# Patient Record
Sex: Male | Born: 1995 | Race: Black or African American | Hispanic: No | Marital: Single | Smoking: Never smoker
Health system: Southern US, Community
[De-identification: ages and names within clinical notes are randomized; demographics above are authoritative.]

## PROBLEM LIST (undated history)

## (undated) DIAGNOSIS — J40 Bronchitis, not specified as acute or chronic: Secondary | ICD-10-CM

## (undated) DIAGNOSIS — J45909 Unspecified asthma, uncomplicated: Secondary | ICD-10-CM

## (undated) DIAGNOSIS — M1990 Unspecified osteoarthritis, unspecified site: Secondary | ICD-10-CM

## (undated) HISTORY — DX: Unspecified asthma, uncomplicated: J45.909

---
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
## 2017-06-15 ENCOUNTER — Emergency Department (HOSPITAL_COMMUNITY)
Admission: EM | Admit: 2017-06-15 | Discharge: 2017-06-15 | Disposition: A | Discharge status: Home / Self Care | Payer: Medicaid Other | Attending: Emergency Medicine | Admitting: Emergency Medicine

## 2017-06-15 ENCOUNTER — Encounter (HOSPITAL_COMMUNITY): Payer: Self-pay | Admitting: Adult Health

## 2017-06-15 ENCOUNTER — Emergency Department (HOSPITAL_COMMUNITY): Payer: Medicaid Other

## 2017-06-15 DIAGNOSIS — S99912A Unspecified injury of left ankle, initial encounter: Secondary | ICD-10-CM | POA: Diagnosis present

## 2017-06-15 DIAGNOSIS — M25472 Effusion, left ankle: Secondary | ICD-10-CM | POA: Diagnosis not present

## 2017-06-15 DIAGNOSIS — W03XXXA Other fall on same level due to collision with another person, initial encounter: Secondary | ICD-10-CM | POA: Insufficient documentation

## 2017-06-15 DIAGNOSIS — Y92321 Football field as the place of occurrence of the external cause: Secondary | ICD-10-CM | POA: Diagnosis not present

## 2017-06-15 DIAGNOSIS — Y999 Unspecified external cause status: Secondary | ICD-10-CM | POA: Diagnosis not present

## 2017-06-15 DIAGNOSIS — Y9361 Activity, american tackle football: Secondary | ICD-10-CM | POA: Diagnosis not present

## 2017-06-15 DIAGNOSIS — S96912A Strain of unspecified muscle and tendon at ankle and foot level, left foot, initial encounter: Secondary | ICD-10-CM | POA: Diagnosis not present

## 2017-06-15 DIAGNOSIS — J45909 Unspecified asthma, uncomplicated: Secondary | ICD-10-CM | POA: Insufficient documentation

## 2017-06-15 MED ORDER — TRAMADOL HCL 50 MG PO TABS: 50.0000 mg | ORAL_TABLET | Freq: Four times a day (QID) | ORAL | 0 refills | Status: AC | PRN

## 2017-06-15 NOTE — ED Triage Notes (Signed)
Presents with left ankle injury. injury occurred last night while playing football. Using ice, elevation and ibuprofen last night at home. Pain is worse with movement. Swelling noted to lateral aspect of left ankle.

## 2017-06-15 NOTE — ED Notes (Signed)
Pt denies taking anything at home for pain today.  Pt states unable to put weight on left ankle

## 2017-06-15 NOTE — ED Provider Notes (Signed)
[facility]-EMERGENCY DEPT Provider Note   CSN: [id number] Arrival date & time: 06/15/17  1015     History   Chief Complaint Chief Complaint  Patient presents with  . Ankle Pain    HPI Chad Snyder is a 21 y.o. male.  HPI  Chad Snyder is a 21 y.o. male who presents to the Emergency Department complaining of left ankle pain and swelling for one day.  He states that he injured his ankle while playing football and ankle "rolled" during a tackle.  Complains of pain and swelling to the lateral ankle that is worse with weight bearing.  Taken ibuprofen w/o relief.  He is unable to bear weight du to pain.  He denies neck, back or head pain, LOC, numbness or weakness to the lower extremities..   Past Medical History:  Diagnosis Date  . Arthritis   . Asthma   . Bronchitis     Patient Active Problem List   Diagnosis Date Noted  . Finger fracture 01/12/2013    History reviewed. No pertinent surgical history.     Home Medications    Prior to Admission medications   Medication Sig Start Date End Date Taking? Authorizing Provider  albuterol (PROVENTIL HFA;VENTOLIN HFA) 108 (90 Base) MCG/ACT inhaler Inhale 1-2 puffs into the lungs every 6 (six) hours as needed for wheezing or shortness of breath. 11/30/15   Ivery Quale, PA-C  HYDROcodone-acetaminophen (NORCO/VICODIN) 5-325 MG per tablet Take 1 tablet by mouth every 4 (four) hours as needed for pain. Patient not taking: Reported on 07/25/2015 01/01/13   Janne Napoleon, NP  ibuprofen (ADVIL,MOTRIN) 600 MG tablet Take 1 tablet (600 mg total) by mouth every 6 (six) hours as needed for pain. Patient not taking: Reported on 07/25/2015 01/01/13   Janne Napoleon, NP  traMADol (ULTRAM) 50 MG tablet Take 1 tablet (50 mg total) by mouth every 6 (six) hours as needed. 06/15/17   Pauline Aus, PA-C    Family History Family History  Problem Relation Age of Onset  . Cancer Unknown   . Asthma Unknown   . Diabetes Unknown      Social History Social History  Substance Use Topics  . Smoking status: Never Smoker  . Smokeless tobacco: Not on file  . Alcohol use No     Allergies   Peanuts [peanut oil]   Review of Systems Review of Systems  Constitutional: Negative for chills and fever.  Gastrointestinal: Negative for nausea and vomiting.  Musculoskeletal: Positive for arthralgias (left ankle pain) and joint swelling.  Skin: Negative for color change and wound.  Neurological: Negative for weakness and numbness.  Hematological: Negative for adenopathy.  All other systems reviewed and are negative.    Physical Exam Updated Vital Signs BP (!) 152/84   Pulse (!) 57   Temp 98.2 F (36.8 C) (Oral)   Resp 18   Ht 5\' 10"  (1.778 m)   Wt 68.9 kg (152 lb)   SpO2 100%   BMI 21.81 kg/m   Physical Exam  Constitutional: He is oriented to person, place, and time. He appears well-developed and well-nourished. No distress.  HENT:  Head: Normocephalic and atraumatic.  Cardiovascular: Normal rate, regular rhythm, normal heart sounds and intact distal pulses.   Pulmonary/Chest: Effort normal and breath sounds normal.  Musculoskeletal: He exhibits tenderness.  ttp of the lateral left ankle, mild to moderate edema.  No open wounds.   No erythema, abrasion, bruising or bony deformity.  No proximal tenderness. Compartments soft  Neurological: He is alert and oriented to person, place, and time. He exhibits normal muscle tone. Coordination normal.  Skin: Skin is warm and dry. Capillary refill takes less than 2 seconds.  Nursing note and vitals reviewed.    ED Treatments / Results  Labs (all labs ordered are listed, but only abnormal results are displayed) Labs Reviewed - No data to display  EKG  EKG Interpretation None       Radiology Dg Ankle Complete Left  Result Date: 06/15/2017 CLINICAL DATA:  Pain after trauma EXAM: LEFT ANKLE COMPLETE - 3+ VIEW COMPARISON:  None. FINDINGS: Lateral soft tissue  swelling. No fracture or dislocation. The ankle mortise is intact. No other acute abnormalities. IMPRESSION: Soft-tissue swelling with no underlying bony abnormality identified. Electronically Signed   By: Gerome Sam III M.D   On: 06/15/2017 11:40    Procedures Procedures (including critical care time)  Medications Ordered in ED Medications - No data to display   Initial Impression / Assessment and Plan / ED Course  I have reviewed the triage vital signs and the nursing notes.  Pertinent labs & imaging results that were available during my care of the patient were reviewed by me and considered in my medical decision making (see chart for details).     NV intact.  XR reasuring.   Crutches and ASO splint applied.  Pain improved.   Pt prefers Romeo Apple f/u and agrees to RICE therapy.    Final Clinical Impressions(s) / ED Diagnoses   Final diagnoses:  Strain of left ankle, initial encounter    New Prescriptions Discharge Medication List as of 06/15/2017 12:07 PM    START taking these medications   Details  traMADol (ULTRAM) 50 MG tablet Take 1 tablet (50 mg total) by mouth every 6 (six) hours as needed., Starting Sat 06/15/2017, Print         Pauline Aus, PA-C 06/15/17 2049    Bethann Berkshire, MD 06/16/17 [phone]

## 2017-06-15 NOTE — Discharge Instructions (Signed)
Elevate and apply ice packs on;/off.  Use the crutches for weight bearing for at least one week.  Take ibuprofen 600 mg every 6-8 hrs for pain.  Call Dr. Mort Sawyers office to arrange a follow-up appt.

## 2017-06-20 ENCOUNTER — Ambulatory Visit (INDEPENDENT_AMBULATORY_CARE_PROVIDER_SITE_OTHER): Payer: Medicaid Other | Admitting: Orthopaedic Surgery

## 2017-06-20 ENCOUNTER — Encounter: Payer: Self-pay | Admitting: Orthopaedic Surgery

## 2017-06-20 VITALS — BP 121/64 | HR 66 | Temp 97.7°F | Ht 68.0 in | Wt 153.0 lb

## 2017-06-20 DIAGNOSIS — S96912A Strain of unspecified muscle and tendon at ankle and foot level, left foot, initial encounter: Secondary | ICD-10-CM

## 2017-06-20 NOTE — Progress Notes (Signed)
Subjective:    Patient ID: Chad Snyder, male    DOB: [birth year], 21 y.o.   MRN: [id number]  HPI He hurt his left ankle playing football last Friday night for [employer].  He is a running back.  He fell in a twisting injury and then another player landed on him.  He had X-rays done the following day at the ER and they were negative. He has crutches and an ankle brace.  He has no other injury.  He is on Tramadol and using ice.   Review of Systems  HENT: Negative for congestion.   Respiratory: Negative for cough and shortness of breath.   Cardiovascular: Negative for chest pain and leg swelling.  Endocrine: Negative for cold intolerance.  Musculoskeletal: Positive for arthralgias, gait problem and joint swelling.  Allergic/Immunologic: Negative for environmental allergies.  All other systems reviewed and are negative.  Past Medical History:  Diagnosis Date  . Arthritis   . Asthma   . Bronchitis     History reviewed. No pertinent surgical history.  Current Outpatient Prescriptions on File Prior to Visit  Medication Sig Dispense Refill  . albuterol (PROVENTIL HFA;VENTOLIN HFA) 108 (90 Base) MCG/ACT inhaler Inhale 1-2 puffs into the lungs every 6 (six) hours as needed for wheezing or shortness of breath. 1 Inhaler 0  . traMADol (ULTRAM) 50 MG tablet Take 1 tablet (50 mg total) by mouth every 6 (six) hours as needed. 15 tablet 0  . HYDROcodone-acetaminophen (NORCO/VICODIN) 5-325 MG per tablet Take 1 tablet by mouth every 4 (four) hours as needed for pain. (Patient not taking: Reported on 07/25/2015) 6 tablet 0  . ibuprofen (ADVIL,MOTRIN) 600 MG tablet Take 1 tablet (600 mg total) by mouth every 6 (six) hours as needed for pain. (Patient not taking: Reported on 07/25/2015) 30 tablet 0   No current facility-administered medications on file prior to visit.     Social History   Social History  . Marital status: Single    Spouse name: N/A  . Number of children: N/A  . Years  of education: N/A   Occupational History  . Not on file.   Social History Main Topics  . Smoking status: Never Smoker  . Smokeless tobacco: Never Used  . Alcohol use No  . Drug use: No  . Sexual activity: Not on file   Other Topics Concern  . Not on file   Social History Narrative  . No narrative on file    Family History  Problem Relation Age of Onset  . Cancer Unknown   . Asthma Unknown   . Diabetes Unknown   . Hypertension Paternal Grandfather     BP 121/64   Pulse 66   Temp 97.7 F (36.5 C)   Ht 5\' 8"  (1.727 m)   Wt 153 lb (69.4 kg)   BMI 23.26 kg/m      Objective:   Physical Exam  Constitutional: He is oriented to person, place, and time. He appears well-developed and well-nourished.  HENT:  Head: Normocephalic and atraumatic.  Eyes: Pupils are equal, round, and reactive to light. Conjunctivae and EOM are normal.  Neck: Normal range of motion. Neck supple.  Cardiovascular: Normal rate, regular rhythm and intact distal pulses.   Pulmonary/Chest: Effort normal.  Abdominal: Soft.  Musculoskeletal: Tenderness: Left ankle tender laterally, very minimal swelling, pain over anterior talo-fibular ligament, ROM full, NV intact. right side negative.  Neurological: He is alert and oriented to person, place, and time. He has normal  reflexes. He displays normal reflexes. No cranial nerve deficit. He exhibits normal muscle tone. Coordination normal.  Skin: Skin is warm and dry.  Psychiatric: He has a normal mood and affect. His behavior is normal. Judgment and thought content normal.  Vitals reviewed.         Assessment & Plan:   Encounter Diagnosis  Name Primary?  . Strain of left ankle, initial encounter Yes   Contrast bath sheet given.  Continue brace and crutches.  No football tomorrow.  Return in one week.  Call if any problem.  Precautions discussed.   Electronically Signed Darreld McleanWayne Burlon Centrella, MD 8/30/20189:05 AM

## 2017-06-20 NOTE — Patient Instructions (Addendum)
   Note for out of school

## 2017-06-27 ENCOUNTER — Ambulatory Visit (INDEPENDENT_AMBULATORY_CARE_PROVIDER_SITE_OTHER): Payer: Medicaid Other | Admitting: Orthopaedic Surgery

## 2017-06-27 ENCOUNTER — Encounter: Payer: Self-pay | Admitting: Orthopaedic Surgery

## 2017-06-27 VITALS — BP 130/70 | HR 66 | Temp 97.7°F | Ht 68.0 in | Wt 146.0 lb

## 2017-06-27 DIAGNOSIS — S96912D Strain of unspecified muscle and tendon at ankle and foot level, left foot, subsequent encounter: Secondary | ICD-10-CM | POA: Diagnosis not present

## 2017-06-27 NOTE — Progress Notes (Signed)
Patient OZ:HYQMVHQ:Chad Snyder, male DOB:01/31/1996, 21 y.o. ION:[id number]  Chief Complaint  Patient presents with  . Follow-up    Left ankle strain    HPI  Chad Snyder is a 21 y.o. male who has left ankle strain.  He is better. He has less pain of the ankle.  He has less swelling.  He has been using the crutches and ankle brace.  He may go to one crutch with the brace and weight bear as tolerated now. HPI  Body mass index is 22.2 kg/m.  ROS  Review of Systems  HENT: Negative for congestion.   Respiratory: Negative for cough and shortness of breath.   Cardiovascular: Negative for chest pain and leg swelling.  Endocrine: Negative for cold intolerance.  Musculoskeletal: Positive for arthralgias, gait problem and joint swelling.  Allergic/Immunologic: Negative for environmental allergies.  All other systems reviewed and are negative.   Past Medical History:  Diagnosis Date  . Arthritis   . Asthma   . Bronchitis     History reviewed. No pertinent surgical history.  Family History  Problem Relation Age of Onset  . Cancer Unknown   . Asthma Unknown   . Diabetes Unknown   . Hypertension Paternal Grandfather     Social History Social History  Substance Use Topics  . Smoking status: Never Smoker  . Smokeless tobacco: Never Used  . Alcohol use No    Allergies  Allergen Reactions  . Peanuts [Peanut Oil] Shortness Of Breath and Swelling    Current Outpatient Prescriptions  Medication Sig Dispense Refill  . albuterol (PROVENTIL HFA;VENTOLIN HFA) 108 (90 Base) MCG/ACT inhaler Inhale 1-2 puffs into the lungs every 6 (six) hours as needed for wheezing or shortness of breath. 1 Inhaler 0  . HYDROcodone-acetaminophen (NORCO/VICODIN) 5-325 MG per tablet Take 1 tablet by mouth every 4 (four) hours as needed for pain. (Patient not taking: Reported on 07/25/2015) 6 tablet 0  . ibuprofen (ADVIL,MOTRIN) 600 MG tablet Take 1 tablet (600 mg total) by mouth every 6  (six) hours as needed for pain. (Patient not taking: Reported on 07/25/2015) 30 tablet 0  . traMADol (ULTRAM) 50 MG tablet Take 1 tablet (50 mg total) by mouth every 6 (six) hours as needed. 15 tablet 0   No current facility-administered medications for this visit.      Physical Exam  Blood pressure 130/70, pulse 66, temperature 97.7 F (36.5 C), height 5\' 8"  (1.727 m), weight 146 lb (66.2 kg).  Constitutional: overall normal hygiene, normal nutrition, well developed, normal grooming, normal body habitus. Assistive device:crutches, ankle brace left  Musculoskeletal: gait and station Limp left, muscle tone and strength are normal, no tremors or atrophy is present.  .  Neurological: coordination overall normal.  Deep tendon reflex/nerve stretch intact.  Sensation normal.  Cranial nerves II-XII intact.   Skin:   Normal overall no scars, lesions, ulcers or rashes. No psoriasis.  Psychiatric: Alert and oriented x 3.  Recent memory intact, remote memory unclear.  Normal mood and affect. Well groomed.  Good eye contact.  Cardiovascular: overall no swelling, no varicosities, no edema bilaterally, normal temperatures of the legs and arms, no clubbing, cyanosis and good capillary refill.  Lymphatic: palpation is normal.  His left ankle has slight lateral swelling, NV intact.  Tender over the anterior talofibular ligament.  ROM is good.  The patient has been educated about the nature of the problem(s) and counseled on treatment options.  The patient appeared to understand what I have  discussed and is in agreement with it.  Encounter Diagnosis  Name Primary?  . Strain of left ankle, subsequent encounter Yes    PLAN Call if any problems.  Precautions discussed.  Continue current medications.   Return to clinic 2 weeks   Electronically Signed Darreld Mclean, MD 9/6/20188:34 AM

## 2017-06-27 NOTE — Patient Instructions (Signed)
May use one crutch, left ankle brace.  Weight bear as tolerated.

## 2017-07-11 ENCOUNTER — Encounter: Payer: Self-pay | Admitting: Orthopaedic Surgery

## 2017-07-11 ENCOUNTER — Ambulatory Visit (INDEPENDENT_AMBULATORY_CARE_PROVIDER_SITE_OTHER): Payer: Medicaid Other | Admitting: Orthopaedic Surgery

## 2017-07-11 VITALS — BP 123/83 | HR 63 | Ht 68.0 in | Wt 147.0 lb

## 2017-07-11 DIAGNOSIS — S96912D Strain of unspecified muscle and tendon at ankle and foot level, left foot, subsequent encounter: Secondary | ICD-10-CM

## 2017-07-11 NOTE — Progress Notes (Signed)
Patient Chad Snyder, male DOB:[birth year], 21 y.o. NWG:[id number]  Chief Complaint  Patient presents with  . Follow-up    left ankle    HPI  Chad Snyder is a 21 y.o. male who has had a strain of the left ankle. He is better now.  He has less pain.  He is still using his brace but has no crutches.  He has no new injury. HPI  Body mass index is 22.35 kg/m.  ROS  Review of Systems  HENT: Negative for congestion.   Respiratory: Negative for cough and shortness of breath.   Cardiovascular: Negative for chest pain and leg swelling.  Endocrine: Negative for cold intolerance.  Musculoskeletal: Positive for arthralgias, gait problem and joint swelling.  Allergic/Immunologic: Negative for environmental allergies.  All other systems reviewed and are negative.   Past Medical History:  Diagnosis Date  . Arthritis   . Asthma   . Bronchitis     History reviewed. No pertinent surgical history.  Family History  Problem Relation Age of Onset  . Cancer Unknown   . Asthma Unknown   . Diabetes Unknown   . Hypertension Paternal Grandfather     Social History Social History  Substance Use Topics  . Smoking status: Never Smoker  . Smokeless tobacco: Never Used  . Alcohol use No    Allergies  Allergen Reactions  . Peanuts [Peanut Oil] Shortness Of Breath and Swelling    Current Outpatient Prescriptions  Medication Sig Dispense Refill  . albuterol (PROVENTIL HFA;VENTOLIN HFA) 108 (90 Base) MCG/ACT inhaler Inhale 1-2 puffs into the lungs every 6 (six) hours as needed for wheezing or shortness of breath. 1 Inhaler 0  . HYDROcodone-acetaminophen (NORCO/VICODIN) 5-325 MG per tablet Take 1 tablet by mouth every 4 (four) hours as needed for pain. (Patient not taking: Reported on 07/25/2015) 6 tablet 0  . ibuprofen (ADVIL,MOTRIN) 600 MG tablet Take 1 tablet (600 mg total) by mouth every 6 (six) hours as needed for pain. (Patient not taking: Reported on 07/25/2015)  30 tablet 0  . traMADol (ULTRAM) 50 MG tablet Take 1 tablet (50 mg total) by mouth every 6 (six) hours as needed. 15 tablet 0   No current facility-administered medications for this visit.      Physical Exam  Blood pressure 123/83, pulse 63, height  (1.727 m), weight 147 lb (66.7 kg).  Constitutional: overall normal hygiene, normal nutrition, well developed, normal grooming, normal body habitus. Assistive device:ankle brace left  Musculoskeletal: gait and station Limp none, muscle tone and strength are normal, no tremors or atrophy is present.  .  Neurological: coordination overall normal.  Deep tendon reflex/nerve stretch intact.  Sensation normal.  Cranial nerves II-XII intact.   Skin:   Normal overall no scars, lesions, ulcers or rashes. No psoriasis.  Psychiatric: Alert and oriented x 3.  Recent memory intact, remote memory unclear.  Normal mood and affect. Well groomed.  Good eye contact.  Cardiovascular: overall no swelling, no varicosities, no edema bilaterally, normal temperatures of the legs and arms, no clubbing, cyanosis and good capillary refill.  Lymphatic: palpation is normal.  All other systems reviewed and are negative   His left ankle is not tender and has full ROM.  He has normal gait.  NV is intact.  It is stable.  The patient has been educated about the nature of the problem(s) and counseled on treatment options.  The patient appeared to understand what I have discussed and is in agreement with it.  Encounter Diagnosis  Name Primary?  . Strain of left ankle, subsequent encounter Yes    PLAN Call if any problems.  Precautions discussed.  Continue current medications.   Return to clinic PRN   I went over precautions for his ankle.  He needs to strap it before playing sports over the next four to six weeks.  Electronically Signed Darreld Mclean, MD 9/20/20188:12 AM

## 2019-07-16 IMAGING — DX DG ANKLE COMPLETE 3+V*L*
3 series · 3 of 3 positions shown · non-contrast
Comparison: None.

CLINICAL DATA: Pain after trauma

EXAM:
LEFT ANKLE COMPLETE - 3+ VIEW

[ankle ap]
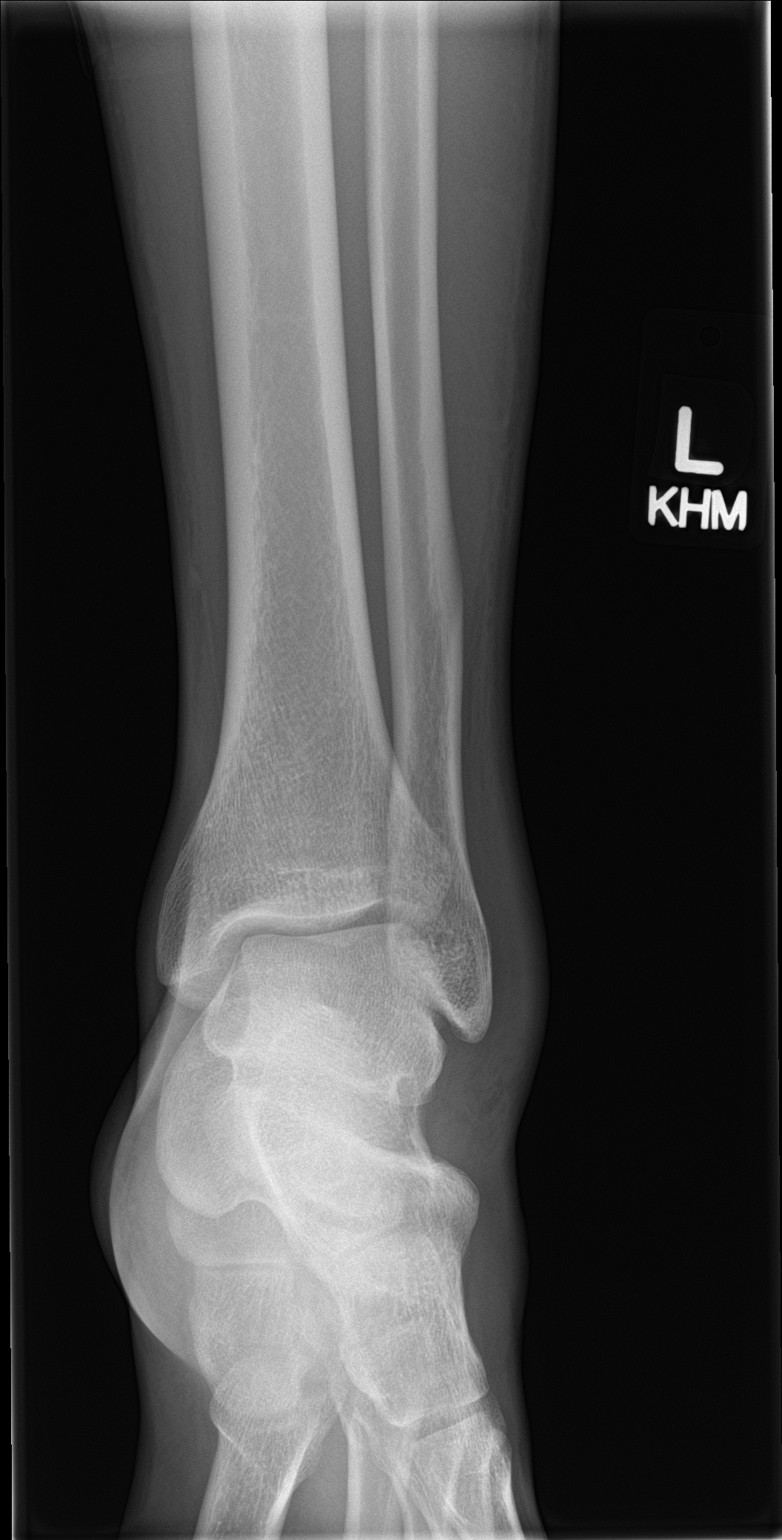

[ankle obl]
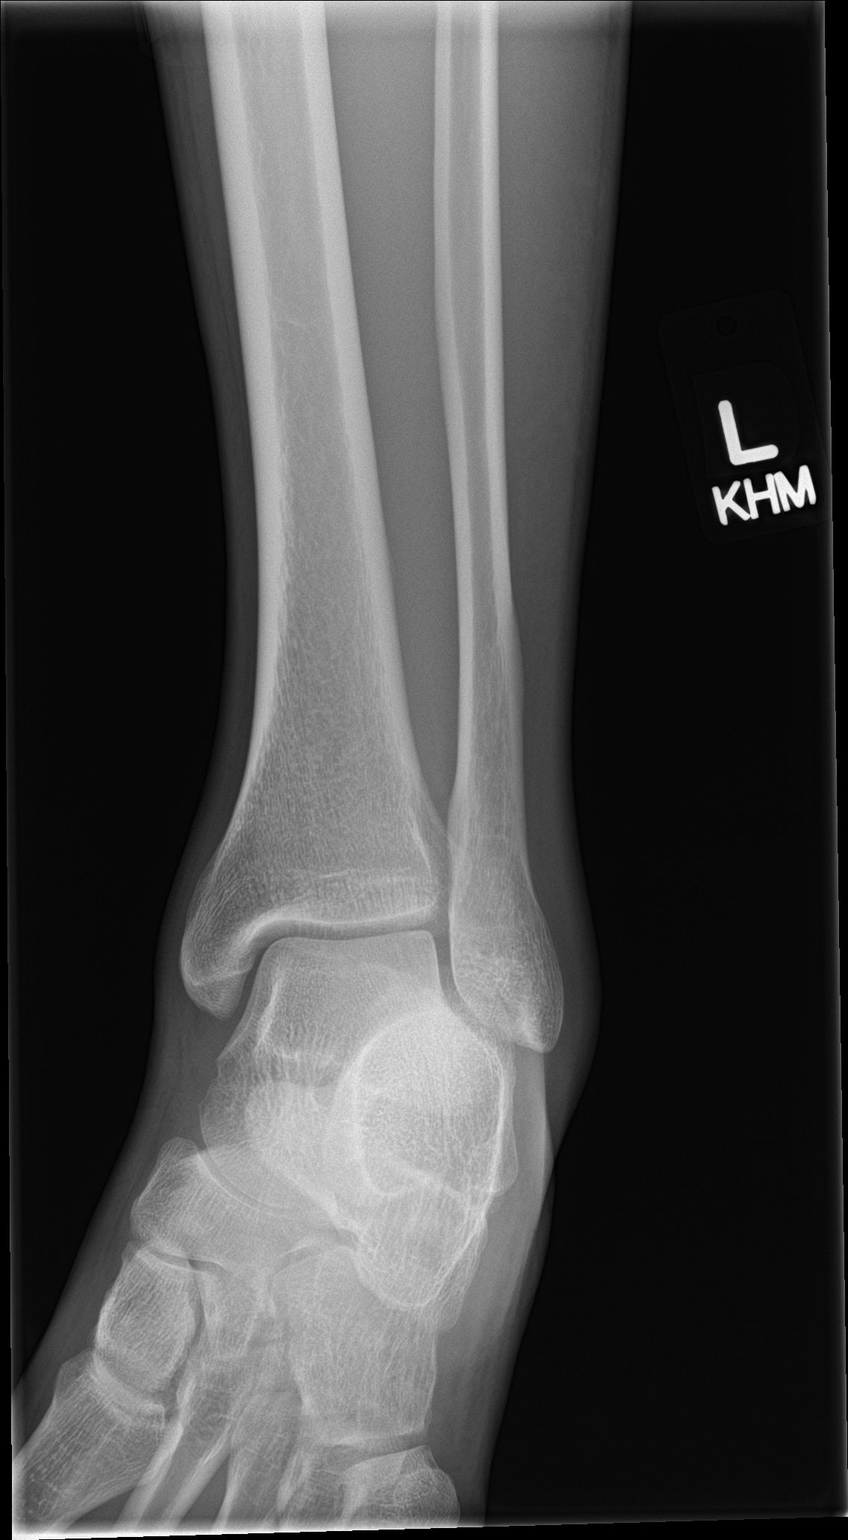

[ankle lat]
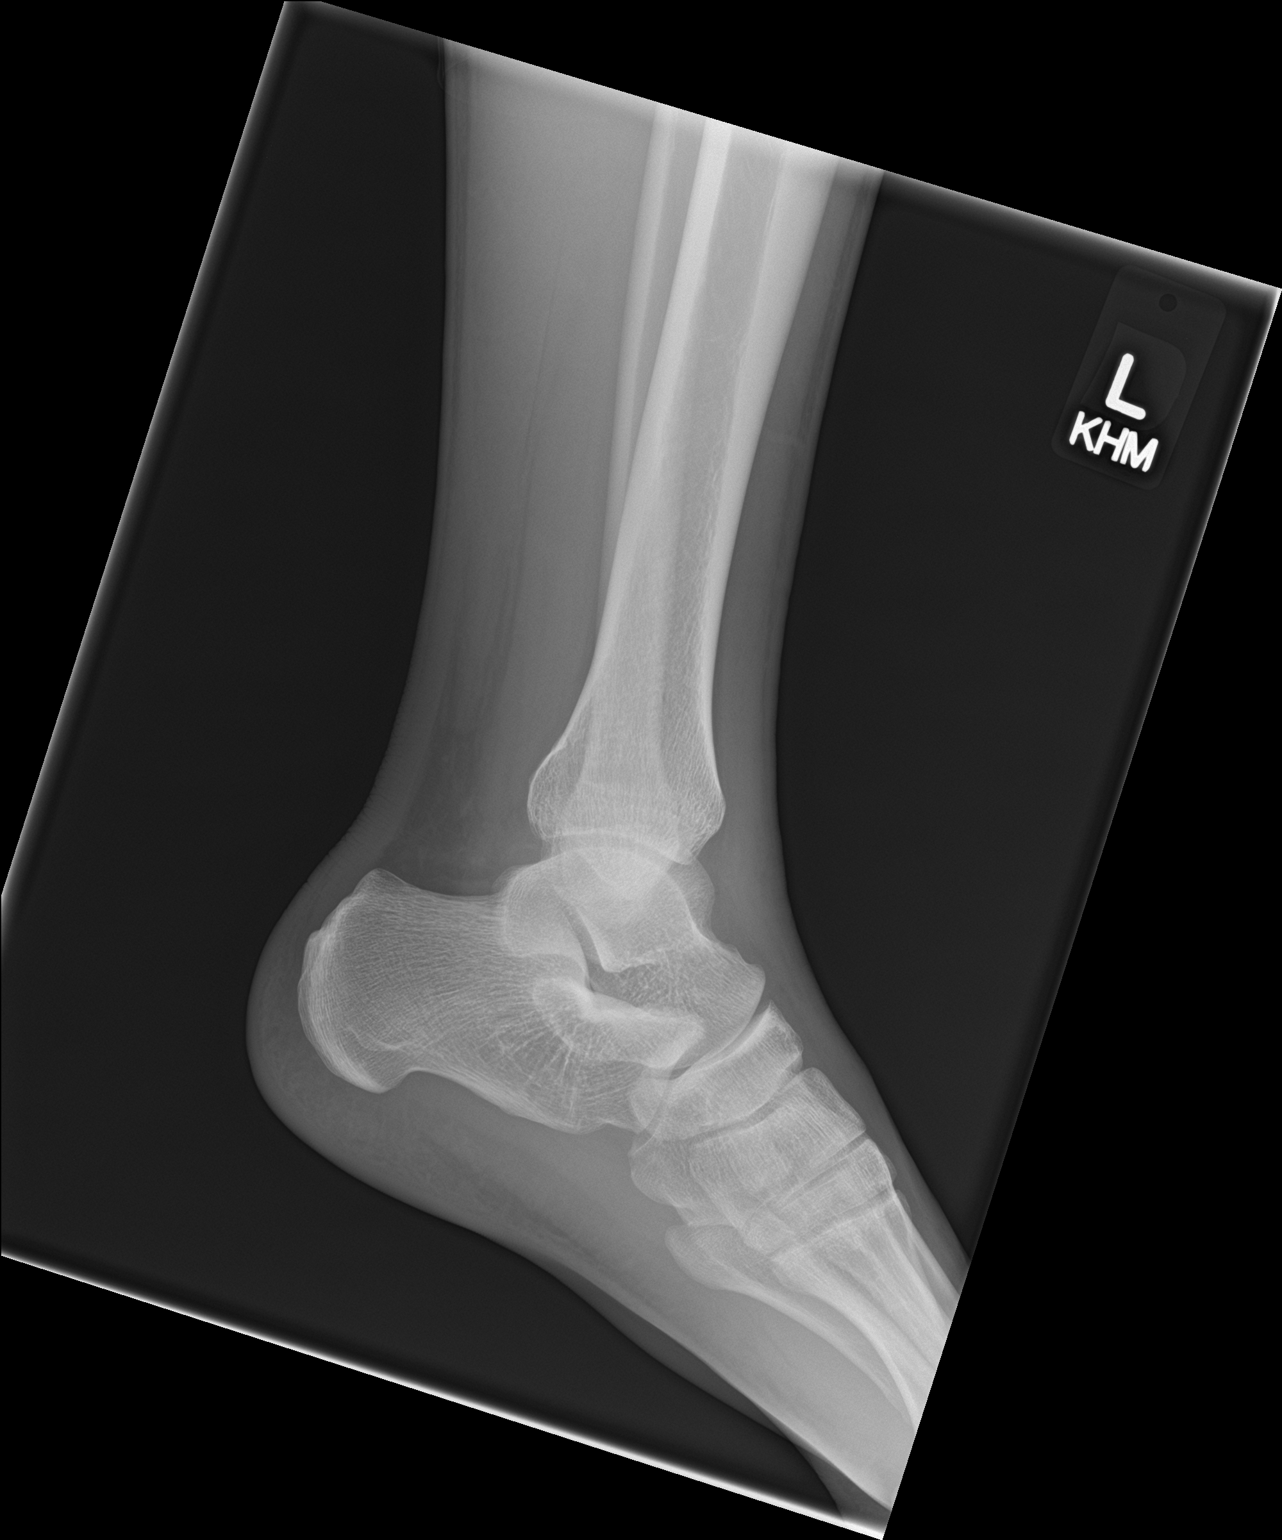

[3 of 3 positions shown; findings below may reference images not displayed]

FINDINGS: Lateral soft tissue swelling. No fracture or dislocation. The ankle
mortise is intact. No other acute abnormalities.
IMPRESSION: Soft-tissue swelling with no underlying bony abnormality identified.

## 2023-07-28 ENCOUNTER — Emergency Department (HOSPITAL_COMMUNITY): Payer: Medicaid Other

## 2023-07-28 ENCOUNTER — Encounter (HOSPITAL_COMMUNITY): Payer: Self-pay

## 2023-07-28 ENCOUNTER — Other Ambulatory Visit: Payer: Self-pay

## 2023-07-28 ENCOUNTER — Emergency Department (HOSPITAL_COMMUNITY)
Admission: EM | Admit: 2023-07-28 | Discharge: 2023-07-28 | Disposition: A | Discharge status: Home / Self Care | Payer: Medicaid Other

## 2023-07-28 DIAGNOSIS — T7801XA Anaphylactic reaction due to peanuts, initial encounter: Secondary | ICD-10-CM | POA: Insufficient documentation

## 2023-07-28 DIAGNOSIS — T782XXA Anaphylactic shock, unspecified, initial encounter: Secondary | ICD-10-CM

## 2023-07-28 LAB — CBC WITH DIFFERENTIAL/PLATELET
Abs Immature Granulocytes: 0 10*3/uL (ref 0.00–0.07)
Basophils Absolute: 0 10*3/uL (ref 0.0–0.1)
Basophils Relative: 0 %
Eosinophils Absolute: 0 10*3/uL (ref 0.0–0.5)
Eosinophils Relative: 0 %
HCT: 53.2 % — ABNORMAL HIGH (ref 39.0–52.0)
Hemoglobin: 17.4 g/dL — ABNORMAL HIGH (ref 13.0–17.0)
Lymphocytes Relative: 43 %
Lymphs Abs: 7.7 10*3/uL — ABNORMAL HIGH (ref 0.7–4.0)
MCH: 27 pg (ref 26.0–34.0)
MCHC: 32.7 g/dL (ref 30.0–36.0)
MCV: 82.6 fL (ref 80.0–100.0)
Monocytes Absolute: 0.5 10*3/uL (ref 0.1–1.0)
Monocytes Relative: 3 %
Neutro Abs: 9.7 10*3/uL — ABNORMAL HIGH (ref 1.7–7.7)
Neutrophils Relative %: 54 %
Platelets: 270 10*3/uL (ref 150–400)
RBC: 6.44 MIL/uL — ABNORMAL HIGH (ref 4.22–5.81)
RDW: 12.4 % (ref 11.5–15.5)
WBC: 17.9 10*3/uL — ABNORMAL HIGH (ref 4.0–10.5)
nRBC: 0 % (ref 0.0–0.2)

## 2023-07-28 LAB — COMPREHENSIVE METABOLIC PANEL
ALT: 18 U/L (ref 0–44)
AST: 28 U/L (ref 15–41)
Albumin: 4 g/dL (ref 3.5–5.0)
Alkaline Phosphatase: 64 U/L (ref 38–126)
Anion gap: 12 (ref 5–15)
BUN: 17 mg/dL (ref 6–20)
CO2: 22 mmol/L (ref 22–32)
Calcium: 8.5 mg/dL — ABNORMAL LOW (ref 8.9–10.3)
Chloride: 101 mmol/L (ref 98–111)
Creatinine, Ser: 1.19 mg/dL (ref 0.61–1.24)
GFR, Estimated: >60 mL/min (ref >60)
Glucose, Bld: 277 mg/dL — ABNORMAL HIGH (ref 70–99)
Potassium: 3.5 mmol/L (ref 3.5–5.1)
Sodium: 135 mmol/L (ref 135–145)
Total Bilirubin: 0.8 mg/dL (ref 0.3–1.2)
Total Protein: 6.6 g/dL (ref 6.5–8.1)

## 2023-07-28 LAB — MAGNESIUM: Magnesium: 1.7 mg/dL (ref 1.7–2.4)

## 2023-07-28 MED ORDER — ALBUTEROL SULFATE (2.5 MG/3ML) 0.083% IN NEBU
INHALATION_SOLUTION | RESPIRATORY_TRACT | Status: AC
  Administered 2023-07-28: 2.5 mg
  Filled 2023-07-28: qty 3

## 2023-07-28 MED ORDER — METHYLPREDNISOLONE SODIUM SUCC 125 MG IJ SOLR
125.0000 mg | Freq: Once | INTRAMUSCULAR | Status: AC
  Administered 2023-07-28: 125 mg via INTRAVENOUS
  Filled 2023-07-28: qty 2

## 2023-07-28 MED ORDER — EPINEPHRINE 0.3 MG/0.3ML IJ SOAJ: 0.3000 mg | INTRAMUSCULAR | 0 refills | Status: AC | PRN

## 2023-07-28 MED ORDER — FAMOTIDINE IN NACL 20-0.9 MG/50ML-% IV SOLN
20.0000 mg | Freq: Once | INTRAVENOUS | Status: AC
  Administered 2023-07-28: 20 mg via INTRAVENOUS
  Filled 2023-07-28: qty 50

## 2023-07-28 MED ORDER — ALBUTEROL SULFATE (2.5 MG/3ML) 0.083% IN NEBU
2.5000 mg | INHALATION_SOLUTION | Freq: Once | RESPIRATORY_TRACT | Status: AC
  Administered 2023-07-28: 2.5 mg via RESPIRATORY_TRACT
  Filled 2023-07-28: qty 3

## 2023-07-28 MED ORDER — DIPHENHYDRAMINE HCL 50 MG/ML IJ SOLN
50.0000 mg | Freq: Once | INTRAMUSCULAR | Status: AC
  Administered 2023-07-28: 50 mg via INTRAVENOUS
  Filled 2023-07-28: qty 1

## 2023-07-28 MED ORDER — ALBUTEROL SULFATE HFA 108 (90 BASE) MCG/ACT IN AERS: 2.0000 | INHALATION_SPRAY | RESPIRATORY_TRACT | 0 refills | Status: AC | PRN

## 2023-07-28 NOTE — ED Notes (Signed)
ED Provider at bedside. 

## 2023-07-28 NOTE — ED Provider Notes (Signed)
[place] EMERGENCY DEPARTMENT AT [facility] Provider Note   CSN: [id number] Arrival date & time: 07/28/23  0600     History {Add pertinent medical, surgical, social history, OB history to HPI:1} Chief Complaint  Patient presents with   Allergic Reaction    Chad Snyder is a 27 y.o. male.  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  Patient states that he ate a Oreo and had peanut butter on it on accident.  Started having shortness of breath nausea and the chest pain.  Had an expired EpiPen at home and used it.  Then called EMS.  On EMS arrival they gave him another EpiPen and some albuterol.  They also gave 25 of oral Benadryl.  Patient states he feels a little bit better still feels a bit short of breath no scratching his throat.  Does not feel like he has much swelling.  No nausea, vomiting, syncope.   Allergic Reaction      Home Medications Prior to Admission medications   Medication Sig Start Date End Date Taking? Authorizing Provider  albuterol (PROVENTIL HFA;VENTOLIN HFA) 108 (90 Base) MCG/ACT inhaler Inhale 1-2 puffs into the lungs every 6 (six) hours as needed for wheezing or shortness of breath. 11/30/15   Ivery Quale, PA-C  HYDROcodone-acetaminophen (NORCO/VICODIN) 5-325 MG per tablet Take 1 tablet by mouth every 4 (four) hours as needed for pain. Patient not taking: Reported on 07/25/2015 01/01/13   Janne Napoleon, NP  ibuprofen (ADVIL,MOTRIN) 600 MG tablet Take 1 tablet (600 mg total) by mouth every 6 (six) hours as needed for pain. Patient not taking: Reported on 07/25/2015 01/01/13   Janne Napoleon, NP  traMADol (ULTRAM) 50 MG tablet Take 1 tablet (50 mg total) by mouth every 6 (six) hours as needed. 06/15/17   Triplett, Tammy, PA-C      Allergies    Peanuts [peanut oil]    Review of Systems   Review of Systems  Physical Exam Updated Vital Signs BP (!) 150/99 (BP Location: Left Arm)    Pulse (!) 118   Temp (!) 97.5 F (36.4 C) (Oral)   Resp (!) 22   Ht 5\' 8"  (1.727 m)   Wt 70.3 kg   SpO2 95%   BMI 23.57 kg/m  Physical Exam Vitals and nursing note reviewed.  Constitutional:      Appearance: He is well-developed.  HENT:     Head: Normocephalic and atraumatic.  Eyes:     Pupils: Pupils are equal, round, and reactive to light.  Cardiovascular:     Rate and Rhythm: Tachycardia present.  Pulmonary:     Effort: Pulmonary effort is normal. Tachypnea present. No respiratory distress.     Breath sounds: Decreased air movement present. Wheezing present.  Abdominal:     General: There is no distension.  Musculoskeletal:        General: Normal range of motion.     Cervical back: Normal range of motion.  Skin:    General: Skin is warm and dry.  Neurological:     General: No focal deficit present.     Mental Status: He is alert.     ED Results / Procedures / Treatments   Labs (all labs ordered are listed, but only abnormal results are displayed) Labs Reviewed - No data to display  EKG None  Radiology No results found.  Procedures .Critical Care  Performed by: Marily Memos, MD Authorized by: Clayborne Dana,  Barbara Cower, MD   Critical care provider statement:    Critical care time (minutes):  30   Critical care was necessary to treat or prevent imminent or life-threatening deterioration of the following conditions:  Respiratory failure   Critical care was time spent personally by me on the following activities:  Development of treatment plan with patient or surrogate, discussions with consultants, evaluation of patient's response to treatment, examination of patient, ordering and review of laboratory studies, ordering and review of radiographic studies, ordering and performing treatments and interventions, pulse oximetry, re-evaluation of patient's condition and review of old charts     Medications Ordered in ED Medications  albuterol (PROVENTIL,VENTOLIN) solution  continuous neb (has no administration in time range)  methylPREDNISolone sodium succinate (SOLU-MEDROL) 125 mg/2 mL injection 125 mg (has no administration in time range)  famotidine (PEPCID) IVPB 20 mg premix (has no administration in time range)  diphenhydrAMINE (BENADRYL) injection 50 mg (has no administration in time range)    ED Course/ Medical Decision Making/ A&P                                 Medical Decision Making Risk Prescription drug management.  27 year old male with likely anaphylaxis still with persistent symptoms now with wheezing and clear his throat often.  Will give steroids, H1, H2 blockers and continuous albuterol for now.  Will get a portable chest x-ray.  He does smoke but his sats are 93 to 95% with tachypnea.  Tachycardia could be related to the epinephrine with albuterol received and seen with his blood pressure definitely no evidence of shock at this time.  Patient will require multiple reevaluations and prolonged observation.  If he gets a third dose of that he probably will need to be admitted.  Will check labs in case that is needed. ***  {Document critical care time when appropriate:1} {Document review of labs and clinical decision tools ie heart score, Chads2Vasc2 etc:1}  {Document your independent review of radiology images, and any outside records:1} {Document your discussion with family members, caretakers, and with consultants:1} {Document social determinants of health affecting pt's care:1} {Document your decision making why or why not admission, treatments were needed:1} Final Clinical Impression(s) / ED Diagnoses Final diagnoses:  None    Rx / DC Orders ED Discharge Orders     None

## 2023-07-28 NOTE — Discharge Instructions (Signed)
Please follow-up with your primary doctor.  Please fill your EpiPen and keep it with you.  Return immediately develop lip swelling, tongue swelling, difficulty swallowing, breathing, chest pain, shortness of breath, lightheadedness, nausea vomiting or any new or worsening symptoms that are concerning to you.

## 2023-07-28 NOTE — ED Provider Notes (Signed)
Received handoff.  27 year old male with pain but neurology ate some peanut butter on accident.  Was given epi x 2 PTA and other allergic meds here in the emergency department.  Symptoms improved.  Signout; ops for recurrence of symptoms.  Reevaluated patient, feeling improved.  Physical Exam  BP 114/74   Pulse 68   Temp 98.9 F (37.2 C) (Oral)   Resp 20   Ht 5\' 8"  (1.727 m)   Wt 70.3 kg   SpO2 98%   BMI 23.57 kg/m   Physical Exam Vitals reviewed.  Constitutional:      General: He is not in acute distress. HENT:     Head: Normocephalic.     Nose: Nose normal.     Mouth/Throat:     Mouth: Mucous membranes are moist.  Eyes:     Conjunctiva/sclera: Conjunctivae normal.  Cardiovascular:     Rate and Rhythm: Normal rate.  Pulmonary:     Effort: Pulmonary effort is normal.  Abdominal:     General: There is no distension.     Tenderness: There is no abdominal tenderness. There is no guarding or rebound.  Skin:    General: Skin is warm and dry.     Capillary Refill: Capillary refill takes less than 2 seconds.  Neurological:     Mental Status: He is alert and oriented to person, place, and time.  Psychiatric:        Mood and Affect: Mood normal.        Behavior: Behavior normal.     Procedures  Procedures  ED Course / MDM   Clinical Course as of 07/28/23 0944  [facility] Jul 28, 2023  [id number] Signout: EPI x 2 PTA after eating Peanut butter. Other anaphalaxis meds given here. Continue to OBS.  [TY]  [id number] Patient continues to have no symptoms.  Feels ready to go home.  Will prescribe EpiPen.  He is also asking for refill albuterol.  Stable for discharge at this time. [TY]    Clinical Course User Index [TY] Coral Spikes, DO   Medical Decision Making 27 year old male with anaphylaxis.  Received signout; see overnight provider's note for full HPI.  Patient labs reviewed leukocytosis noted but suspect reactionary.  No significant metabolic derangements.  Normal kidney function.   Magnesium normal.  Chest x-ray with no infiltrate.  ECG sinus tach.  Will continue to observe for recurrence of symptoms.  See ED course for final MDM and disposition.  Amount and/or Complexity of Data Reviewed Labs: ordered. Radiology: ordered. ECG/medicine tests: ordered.  Risk Prescription drug management.         Coral Spikes, DO 07/28/23 [phone]

## 2023-07-28 NOTE — ED Triage Notes (Signed)
Pt arrived from home via [facility] reporting anaphylactic reaction following consuming peanut butter in an oreo by mistake. Pt administered expired Epi Pen he had at home, and [facility] administered a new Epi Pen to Pt PTA. Pt also receive Albuterol Neb Treatment and 25mg  PO Benadryl PTA. Pt also reports using his inhaler PTA.
